# Patient Record
Sex: Male | Born: 1980 | Race: Black or African American | Hispanic: No | Marital: Married | Smoking: Current every day smoker
Health system: Southern US, Community
[De-identification: ages and names within clinical notes are randomized; demographics above are authoritative.]

## PROBLEM LIST (undated history)

## (undated) HISTORY — PX: TRACHEOSTOMY: SUR1362

---

## 2012-05-19 ENCOUNTER — Inpatient Hospital Stay (HOSPITAL_COMMUNITY)
Admission: EM | Admit: 2012-05-19 | Discharge: 2012-05-25 | DRG: 660 | Disposition: A | Discharge status: Home / Self Care | Payer: No Typology Code available for payment source | Attending: Urology | Admitting: Urology

## 2012-05-19 ENCOUNTER — Emergency Department (HOSPITAL_COMMUNITY): Payer: No Typology Code available for payment source

## 2012-05-19 DIAGNOSIS — S37009A Unspecified injury of unspecified kidney, initial encounter: Secondary | ICD-10-CM | POA: Diagnosis present

## 2012-05-19 DIAGNOSIS — S0181XA Laceration without foreign body of other part of head, initial encounter: Secondary | ICD-10-CM

## 2012-05-19 DIAGNOSIS — E876 Hypokalemia: Secondary | ICD-10-CM

## 2012-05-19 DIAGNOSIS — Q605 Renal hypoplasia, unspecified: Secondary | ICD-10-CM

## 2012-05-19 DIAGNOSIS — S069X9A Unspecified intracranial injury with loss of consciousness of unspecified duration, initial encounter: Secondary | ICD-10-CM

## 2012-05-19 DIAGNOSIS — S060X9A Concussion with loss of consciousness of unspecified duration, initial encounter: Secondary | ICD-10-CM | POA: Diagnosis present

## 2012-05-19 DIAGNOSIS — F17200 Nicotine dependence, unspecified, uncomplicated: Secondary | ICD-10-CM | POA: Diagnosis present

## 2012-05-19 DIAGNOSIS — Q602 Renal agenesis, unspecified: Secondary | ICD-10-CM

## 2012-05-19 DIAGNOSIS — N289 Disorder of kidney and ureter, unspecified: Secondary | ICD-10-CM

## 2012-05-19 DIAGNOSIS — Z5331 Laparoscopic surgical procedure converted to open procedure: Secondary | ICD-10-CM

## 2012-05-19 DIAGNOSIS — D62 Acute posthemorrhagic anemia: Secondary | ICD-10-CM | POA: Diagnosis not present

## 2012-05-19 DIAGNOSIS — S060X0A Concussion without loss of consciousness, initial encounter: Secondary | ICD-10-CM | POA: Diagnosis present

## 2012-05-19 DIAGNOSIS — S37069A Major laceration of unspecified kidney, initial encounter: Principal | ICD-10-CM | POA: Diagnosis present

## 2012-05-19 DIAGNOSIS — S060XAA Concussion with loss of consciousness status unknown, initial encounter: Secondary | ICD-10-CM | POA: Diagnosis present

## 2012-05-19 DIAGNOSIS — IMO0002 Reserved for concepts with insufficient information to code with codable children: Secondary | ICD-10-CM | POA: Diagnosis present

## 2012-05-19 LAB — CBC
HCT: 42.3 % (ref 39.0–52.0)
MCV: 85.6 fL (ref 78.0–100.0)
Platelets: 221 10*3/uL (ref 150–400)
RBC: 4.94 MIL/uL (ref 4.22–5.81)
RDW: 12 % (ref 11.5–15.5)
WBC: 11.6 10*3/uL — ABNORMAL HIGH (ref 4.0–10.5)

## 2012-05-19 LAB — POCT I-STAT, CHEM 8
Chloride: 99 mEq/L (ref 96–112)
Glucose, Bld: 132 mg/dL — ABNORMAL HIGH (ref 70–99)
HCT: 46 % (ref 39.0–52.0)
Hemoglobin: 15.6 g/dL (ref 13.0–17.0)
Potassium: 2.9 mEq/L — ABNORMAL LOW (ref 3.5–5.1)
Sodium: 139 mEq/L (ref 135–145)

## 2012-05-19 MED ORDER — ONDANSETRON HCL 4 MG/2ML IJ SOLN
INTRAMUSCULAR | Status: AC
  Filled 2012-05-19: qty 2

## 2012-05-19 MED ORDER — ONDANSETRON HCL 4 MG/2ML IJ SOLN
4.0000 mg | Freq: Once | INTRAMUSCULAR | Status: AC
  Administered 2012-05-19: 4 mg via INTRAVENOUS

## 2012-05-19 NOTE — ED Notes (Signed)
Positive FAST per Dr. Hyacinth Meeker

## 2012-05-19 NOTE — ED Notes (Signed)
Xray at [facility] 
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
## 2012-05-19 NOTE — ED Notes (Signed)
Xray finished, EDP at [facility] with FAST [place]

## 2012-05-19 NOTE — ED Notes (Signed)
Lab at [facility] 
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
## 2012-05-20 ENCOUNTER — Emergency Department (HOSPITAL_COMMUNITY): Payer: No Typology Code available for payment source

## 2012-05-20 ENCOUNTER — Observation Stay (HOSPITAL_COMMUNITY): Payer: No Typology Code available for payment source

## 2012-05-20 ENCOUNTER — Encounter (HOSPITAL_COMMUNITY): Payer: Self-pay | Admitting: *Deleted

## 2012-05-20 DIAGNOSIS — S060XAA Concussion with loss of consciousness status unknown, initial encounter: Secondary | ICD-10-CM

## 2012-05-20 DIAGNOSIS — S060X9A Concussion with loss of consciousness of unspecified duration, initial encounter: Secondary | ICD-10-CM

## 2012-05-20 DIAGNOSIS — S37009A Unspecified injury of unspecified kidney, initial encounter: Secondary | ICD-10-CM

## 2012-05-20 LAB — COMPREHENSIVE METABOLIC PANEL
Albumin: 3.7 g/dL (ref 3.5–5.2)
BUN: 11 mg/dL (ref 6–23)
Creatinine, Ser: 1.49 mg/dL — ABNORMAL HIGH (ref 0.50–1.35)
GFR calc Af Amer: 72 mL/min — ABNORMAL LOW (ref >90)
Glucose, Bld: 139 mg/dL — ABNORMAL HIGH (ref 70–99)
Total Bilirubin: 0.4 mg/dL (ref 0.3–1.2)
Total Protein: 6.8 g/dL (ref 6.0–8.3)

## 2012-05-20 LAB — ETHANOL: Alcohol, Ethyl (B): 188 mg/dL — ABNORMAL HIGH (ref 0–11)

## 2012-05-20 LAB — URINALYSIS, MICROSCOPIC ONLY
Bilirubin Urine: NEGATIVE
Nitrite: NEGATIVE
Specific Gravity, Urine: 1.036 — ABNORMAL HIGH (ref 1.005–1.030)
Urobilinogen, UA: 0.2 mg/dL (ref 0.0–1.0)
pH: 6 (ref 5.0–8.0)

## 2012-05-20 LAB — CBC
HCT: 41.3 % (ref 39.0–52.0)
Hemoglobin: 14.2 g/dL (ref 13.0–17.0)
MCH: 29.5 pg (ref 26.0–34.0)
MCHC: 34.4 g/dL (ref 30.0–36.0)
RDW: 12 % (ref 11.5–15.5)

## 2012-05-20 LAB — SAMPLE TO BLOOD BANK

## 2012-05-20 LAB — BASIC METABOLIC PANEL
BUN: 11 mg/dL (ref 6–23)
Calcium: 8.2 mg/dL — ABNORMAL LOW (ref 8.4–10.5)
GFR calc Af Amer: 78 mL/min — ABNORMAL LOW (ref >90)
GFR calc non Af Amer: 67 mL/min — ABNORMAL LOW (ref >90)
Glucose, Bld: 128 mg/dL — ABNORMAL HIGH (ref 70–99)
Potassium: 4.3 mEq/L (ref 3.5–5.1)

## 2012-05-20 LAB — CG4 I-STAT (LACTIC ACID): Lactic Acid, Venous: 3.2 mmol/L — ABNORMAL HIGH (ref 0.5–2.2)

## 2012-05-20 MED ORDER — HYDROCODONE-ACETAMINOPHEN 5-325 MG PO TABS
2.0000 | ORAL_TABLET | ORAL | Status: DC | PRN
  Administered 2012-05-20 – 2012-05-21 (×3): 2 via ORAL
  Filled 2012-05-20 (×3): qty 2

## 2012-05-20 MED ORDER — ONDANSETRON HCL 4 MG PO TABS: 4.0000 mg | ORAL_TABLET | Freq: Four times a day (QID) | ORAL | Status: DC | PRN

## 2012-05-20 MED ORDER — FENTANYL CITRATE 0.05 MG/ML IJ SOLN
50.0000 ug | Freq: Once | INTRAMUSCULAR | Status: AC
  Administered 2012-05-20: 50 ug via INTRAVENOUS

## 2012-05-20 MED ORDER — PANTOPRAZOLE SODIUM 40 MG PO TBEC: 40.0000 mg | DELAYED_RELEASE_TABLET | Freq: Every day | ORAL | Status: DC

## 2012-05-20 MED ORDER — THIAMINE HCL 100 MG/ML IJ SOLN
100.0000 mg | Freq: Every day | INTRAMUSCULAR | Status: DC
  Administered 2012-05-20: 100 mg via INTRAVENOUS
  Filled 2012-05-20 (×3): qty 1

## 2012-05-20 MED ORDER — VITAMIN B-1 100 MG PO TABS
100.0000 mg | ORAL_TABLET | Freq: Every day | ORAL | Status: DC
  Administered 2012-05-21 – 2012-05-22 (×2): 100 mg via ORAL
  Filled 2012-05-20 (×3): qty 1

## 2012-05-20 MED ORDER — NICOTINE 21 MG/24HR TD PT24
21.0000 mg | MEDICATED_PATCH | Freq: Every day | TRANSDERMAL | Status: DC
  Administered 2012-05-20 – 2012-05-22 (×3): 21 mg via TRANSDERMAL
  Filled 2012-05-20 (×4): qty 1

## 2012-05-20 MED ORDER — FENTANYL CITRATE 0.05 MG/ML IJ SOLN
INTRAMUSCULAR | Status: AC | PRN
  Administered 2012-05-20: 50 ug via INTRAVENOUS

## 2012-05-20 MED ORDER — HYDROMORPHONE HCL PF 1 MG/ML IJ SOLN
1.0000 mg | INTRAMUSCULAR | Status: DC | PRN
  Administered 2012-05-20 (×5): 1 mg via INTRAVENOUS
  Filled 2012-05-20 (×4): qty 1

## 2012-05-20 MED ORDER — FENTANYL CITRATE 0.05 MG/ML IJ SOLN
INTRAMUSCULAR | Status: AC
  Filled 2012-05-20: qty 2

## 2012-05-20 MED ORDER — LORAZEPAM 2 MG/ML IJ SOLN
1.0000 mg | Freq: Four times a day (QID) | INTRAMUSCULAR | Status: DC | PRN
  Administered 2012-05-20: 1 mg via INTRAVENOUS

## 2012-05-20 MED ORDER — ONDANSETRON HCL 4 MG/2ML IJ SOLN
INTRAMUSCULAR | Status: AC
  Filled 2012-05-20: qty 2

## 2012-05-20 MED ORDER — FENTANYL CITRATE 0.05 MG/ML IJ SOLN
INTRAMUSCULAR | Status: AC
  Filled 2012-05-20: qty 4

## 2012-05-20 MED ORDER — KCL IN DEXTROSE-NACL 20-5-0.45 MEQ/L-%-% IV SOLN
INTRAVENOUS | Status: DC
  Administered 2012-05-20 (×2): via INTRAVENOUS
  Filled 2012-05-20 (×5): qty 1000

## 2012-05-20 MED ORDER — HYDROMORPHONE HCL PF 1 MG/ML IJ SOLN
INTRAMUSCULAR | Status: AC
  Administered 2012-05-20: 1 mg via INTRAVENOUS
  Filled 2012-05-20: qty 1

## 2012-05-20 MED ORDER — FOLIC ACID 1 MG PO TABS
1.0000 mg | ORAL_TABLET | Freq: Every day | ORAL | Status: DC
  Administered 2012-05-20 – 2012-05-22 (×3): 1 mg via ORAL
  Filled 2012-05-20 (×3): qty 1

## 2012-05-20 MED ORDER — BISACODYL 10 MG RE SUPP: 10.0000 mg | Freq: Every day | RECTAL | Status: DC | PRN

## 2012-05-20 MED ORDER — MIDAZOLAM HCL 2 MG/2ML IJ SOLN
INTRAMUSCULAR | Status: AC | PRN
  Administered 2012-05-20 (×2): 1 mg via INTRAVENOUS

## 2012-05-20 MED ORDER — IOHEXOL 300 MG/ML  SOLN
100.0000 mL | Freq: Once | INTRAMUSCULAR | Status: AC | PRN
  Administered 2012-05-20: 100 mL via INTRAVENOUS

## 2012-05-20 MED ORDER — LORAZEPAM 1 MG PO TABS: 1.0000 mg | ORAL_TABLET | Freq: Four times a day (QID) | ORAL | Status: DC | PRN

## 2012-05-20 MED ORDER — PANTOPRAZOLE SODIUM 40 MG IV SOLR
40.0000 mg | Freq: Every day | INTRAVENOUS | Status: DC
  Administered 2012-05-20: 40 mg via INTRAVENOUS
  Filled 2012-05-20 (×2): qty 40

## 2012-05-20 MED ORDER — MIDAZOLAM HCL 2 MG/2ML IJ SOLN
INTRAMUSCULAR | Status: AC
  Filled 2012-05-20: qty 4

## 2012-05-20 MED ORDER — LORAZEPAM 2 MG/ML IJ SOLN
INTRAMUSCULAR | Status: AC
  Filled 2012-05-20: qty 1

## 2012-05-20 MED ORDER — ADULT MULTIVITAMIN W/MINERALS CH
1.0000 | ORAL_TABLET | Freq: Every day | ORAL | Status: DC
  Administered 2012-05-20 – 2012-05-22 (×3): 1 via ORAL
  Filled 2012-05-20 (×3): qty 1

## 2012-05-20 MED ORDER — ONDANSETRON HCL 4 MG/2ML IJ SOLN: 4.0000 mg | Freq: Four times a day (QID) | INTRAMUSCULAR | Status: DC | PRN

## 2012-05-20 MED ORDER — IOHEXOL 300 MG/ML  SOLN
2.0000 mL | Freq: Once | INTRAMUSCULAR | Status: AC | PRN
  Administered 2012-05-20: 2 mL via INTRAVENOUS

## 2012-05-20 MED ORDER — ONDANSETRON HCL 4 MG/2ML IJ SOLN
4.0000 mg | Freq: Once | INTRAMUSCULAR | Status: AC
  Administered 2012-05-20: 4 mg via INTRAVENOUS

## 2012-05-20 MED ORDER — DOCUSATE SODIUM 100 MG PO CAPS
100.0000 mg | ORAL_CAPSULE | Freq: Two times a day (BID) | ORAL | Status: DC
  Administered 2012-05-20 – 2012-05-22 (×4): 100 mg via ORAL
  Filled 2012-05-20 (×5): qty 1

## 2012-05-20 NOTE — ED Notes (Signed)
CT complete, BP trending down, HR trending up.
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
## 2012-05-20 NOTE — ED Notes (Signed)
EDP at [facility], IV tubing changed, no changes, remains alert, NAD, restless, moaning, cooperative, following commands, answering questions, family (x6) with chaplain.

## 2012-05-20 NOTE — Progress Notes (Signed)
Pt transferred to interventional radiology per MD order.

## 2012-05-20 NOTE — Consult Note (Signed)
Agree with PA note.  Discussed case with Dr. Berneice Heinrich.  Will put in a percutaneous nephrostomy tube for urinary diversion until pt can be scheduled for semi-elective laparoscopic nephrectomy early next week.  Will use CT guidance given abnormal anatomy and perinephric hemato-urinoma.  Signed,  Sterling Big, MD Vascular & Interventional Radiologist [facility]

## 2012-05-20 NOTE — ED Notes (Signed)
HR trending upward HR now 124, increased since ativan and dilaudid given, Dr. Lindie Spruce paged. Report called to 3300.

## 2012-05-20 NOTE — Procedures (Signed)
Interventional Radiology Procedure Note  Procedure: Right percutaneous nephrostomy under CT guidance and through the tube CT nephrostogram.  A 11F Cook APD was placed through a posterior interpolar calyx and positioned within the renal pelvis under CT guidance.  Nephrostogram and axial CT confirms location within the collecting system and renal pelvis rupture.  Complications: None Recommendations: - Maintain tube to gravity drainage - May flush gently with 5-10 mL saline PRN - Continue to monitor H&H and renal function - Per Urology, nephrectomy planned for early next week  Signed,  Sterling Big, MD Vascular & Interventional Radiologist [facility] Radiology

## 2012-05-20 NOTE — ED Notes (Signed)
Lab at [facility] drawing blood for [facility]
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
## 2012-05-20 NOTE — ED Notes (Signed)
Dr. Lindie Spruce at [facility] speaking with family.

## 2012-05-20 NOTE — ED Notes (Signed)
Pt restless and moving in CT, delay d/t movement, not able to follow "be still commands", securing extremeties. VSS, no changes.

## 2012-05-20 NOTE — ED Notes (Signed)
Pt's allergies updated per family request

## 2012-05-20 NOTE — ED Notes (Signed)
Scanning kidney delay

## 2012-05-20 NOTE — ED Notes (Signed)
Foley ordered per Dr. Lindie Spruce
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
## 2012-05-20 NOTE — ED Notes (Signed)
No changes, VSS, preparing to move pt to room 33, family called for, intermittant sonorous resps, arousable to voice.

## 2012-05-20 NOTE — ED Notes (Signed)
beginning scan at this time, no changes, moaning, following some commands, VSS.

## 2012-05-20 NOTE — ED Notes (Signed)
Contrast infusion delay, scan continues, no change, VSS.

## 2012-05-20 NOTE — ED Notes (Signed)
Lab at [facility] for 2nd blood draw for [facility]
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
## 2012-05-20 NOTE — Progress Notes (Signed)
Pharmacy asked to dose nicotine patch in patient who smokes ~half a pack a day.   Plan Nicotine patch 21mg  daily  Thank you,  Brett Fairy, PharmD, BCPS 05/20/2012 8:20 PM
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
## 2012-05-20 NOTE — ED Notes (Addendum)
Dilaudid and ativan given, sonorous resps increased, SPO2 dropped to 70% on 4L [place], placed on NRB 10L, SPO2 back up to 100%, family at [facility], no other changes, VSS. Family reports h/o undiagnosed sleep apnea.

## 2012-05-20 NOTE — ED Notes (Signed)
Dr. Lindie Spruce notified, no new orders.
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
## 2012-05-21 ENCOUNTER — Other Ambulatory Visit: Payer: Self-pay | Admitting: Urology

## 2012-05-21 DIAGNOSIS — S37009A Unspecified injury of unspecified kidney, initial encounter: Secondary | ICD-10-CM | POA: Diagnosis present

## 2012-05-21 DIAGNOSIS — S060X9A Concussion with loss of consciousness of unspecified duration, initial encounter: Secondary | ICD-10-CM | POA: Diagnosis present

## 2012-05-21 DIAGNOSIS — D62 Acute posthemorrhagic anemia: Secondary | ICD-10-CM | POA: Diagnosis not present

## 2012-05-21 DIAGNOSIS — Z720 Tobacco use: Secondary | ICD-10-CM | POA: Insufficient documentation

## 2012-05-21 LAB — CBC
MCH: 29.3 pg (ref 26.0–34.0)
Platelets: 176 10*3/uL (ref 150–400)
RBC: 4.06 MIL/uL — ABNORMAL LOW (ref 4.22–5.81)
RDW: 12 % (ref 11.5–15.5)
WBC: 10.3 10*3/uL (ref 4.0–10.5)

## 2012-05-21 LAB — BASIC METABOLIC PANEL
CO2: 27 mEq/L (ref 19–32)
Calcium: 8.6 mg/dL (ref 8.4–10.5)
GFR calc Af Amer: >90 mL/min (ref >90)
Sodium: 137 mEq/L (ref 135–145)

## 2012-05-21 MED ORDER — HYDROCODONE-ACETAMINOPHEN 10-325 MG PO TABS
0.5000 | ORAL_TABLET | ORAL | Status: DC | PRN
  Administered 2012-05-21 – 2012-05-22 (×4): 2 via ORAL
  Filled 2012-05-21 (×4): qty 2

## 2012-05-21 MED ORDER — SODIUM CHLORIDE 0.45 % IV SOLN
INTRAVENOUS | Status: DC
  Administered 2012-05-22: 09:00:00 via INTRAVENOUS

## 2012-05-21 MED ORDER — HYDROMORPHONE HCL PF 1 MG/ML IJ SOLN: 0.5000 mg | INTRAMUSCULAR | Status: DC | PRN

## 2012-05-21 NOTE — Progress Notes (Signed)
Patient transferred to 1B14.  Family at bedside.  Patient oriented to room.  Vital signs stable.  Will continue to monitor.
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
## 2012-05-21 NOTE — Progress Notes (Signed)
Doing fine with nephrostomy/retroperitoneal drain.  This patient has been seen and I agree with the findings and treatment plan.  Marta Lamas. Gae Bon, MD, FACS [phone] (pager) [phone] (direct pager) Trauma Surgeon
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
## 2012-05-21 NOTE — Progress Notes (Signed)
Pt transferred to 6N room 2 per MD order. Report called to receiving nurse and all questions answered.
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
## 2012-05-21 NOTE — Progress Notes (Signed)
UR completed 

## 2012-05-22 ENCOUNTER — Encounter (HOSPITAL_COMMUNITY): Payer: Self-pay | Admitting: Anesthesiology

## 2012-05-22 ENCOUNTER — Encounter (HOSPITAL_COMMUNITY): Admission: EM | Disposition: A | Discharge status: Home / Self Care | Payer: Self-pay | Source: Home / Self Care

## 2012-05-22 ENCOUNTER — Inpatient Hospital Stay (HOSPITAL_COMMUNITY): Payer: No Typology Code available for payment source | Admitting: Anesthesiology

## 2012-05-22 ENCOUNTER — Inpatient Hospital Stay: Admit: 2012-05-22 | Payer: Self-pay | Admitting: Urology

## 2012-05-22 HISTORY — PX: LAPAROSCOPIC NEPHRECTOMY: SHX1930

## 2012-05-22 LAB — BASIC METABOLIC PANEL
BUN: 10 mg/dL (ref 6–23)
CO2: 28 mEq/L (ref 19–32)
GFR calc non Af Amer: 82 mL/min — ABNORMAL LOW (ref >90)
Glucose, Bld: 153 mg/dL — ABNORMAL HIGH (ref 70–99)
Potassium: 4.1 mEq/L (ref 3.5–5.1)
Sodium: 133 mEq/L — ABNORMAL LOW (ref 135–145)

## 2012-05-22 LAB — TYPE AND SCREEN
ABO/RH(D): A POS
Antibody Screen: NEGATIVE

## 2012-05-22 LAB — HEMOGLOBIN AND HEMATOCRIT, BLOOD
HCT: 36.4 % — ABNORMAL LOW (ref 39.0–52.0)
Hemoglobin: 12.3 g/dL — ABNORMAL LOW (ref 13.0–17.0)

## 2012-05-22 LAB — ABO/RH: ABO/RH(D): A POS

## 2012-05-22 SURGERY — NEPHRECTOMY, RADICAL, LAPAROSCOPIC, ADULT
Anesthesia: General | Laterality: Right | Wound class: Clean

## 2012-05-22 MED ORDER — SENNA 8.6 MG PO TABS
1.0000 | ORAL_TABLET | Freq: Two times a day (BID) | ORAL | Status: DC
  Administered 2012-05-23 – 2012-05-25 (×5): 8.6 mg via ORAL
  Filled 2012-05-22 (×5): qty 1

## 2012-05-22 MED ORDER — CLINDAMYCIN PHOSPHATE 900 MG/50ML IV SOLN
900.0000 mg | Freq: Once | INTRAVENOUS | Status: AC
  Administered 2012-05-22: 900 mg via INTRAVENOUS
  Filled 2012-05-22: qty 50

## 2012-05-22 MED ORDER — CISATRACURIUM BESYLATE (PF) 10 MG/5ML IV SOLN
INTRAVENOUS | Status: DC | PRN
  Administered 2012-05-22: 2 mg via INTRAVENOUS
  Administered 2012-05-22: 10 mg via INTRAVENOUS
  Administered 2012-05-22: 4 mg via INTRAVENOUS
  Administered 2012-05-22: 2 mg via INTRAVENOUS

## 2012-05-22 MED ORDER — HYDROMORPHONE HCL PF 1 MG/ML IJ SOLN
0.5000 mg | INTRAMUSCULAR | Status: DC | PRN
  Administered 2012-05-22 – 2012-05-23 (×3): 1 mg via INTRAVENOUS
  Administered 2012-05-23 (×2): 0.5 mg via INTRAVENOUS
  Filled 2012-05-22 (×6): qty 1

## 2012-05-22 MED ORDER — DOCUSATE SODIUM 100 MG PO CAPS
100.0000 mg | ORAL_CAPSULE | Freq: Two times a day (BID) | ORAL | Status: DC
  Administered 2012-05-23 – 2012-05-25 (×5): 100 mg via ORAL
  Filled 2012-05-22 (×7): qty 1

## 2012-05-22 MED ORDER — BUPIVACAINE 0.25 % ON-Q PUMP SINGLE CATH 300ML
INJECTION | Status: DC | PRN
  Administered 2012-05-22: 300 mL

## 2012-05-22 MED ORDER — DEXAMETHASONE SODIUM PHOSPHATE 10 MG/ML IJ SOLN
INTRAMUSCULAR | Status: DC | PRN
  Administered 2012-05-22: 10 mg via INTRAVENOUS

## 2012-05-22 MED ORDER — ACETAMINOPHEN 10 MG/ML IV SOLN
INTRAVENOUS | Status: DC | PRN
  Administered 2012-05-22: 1000 mg via INTRAVENOUS

## 2012-05-22 MED ORDER — BUPIVACAINE 0.25 % ON-Q PUMP SINGLE CATH 300ML
INJECTION | Status: AC
  Filled 2012-05-22: qty 300

## 2012-05-22 MED ORDER — ACETAMINOPHEN 10 MG/ML IV SOLN
1000.0000 mg | Freq: Four times a day (QID) | INTRAVENOUS | Status: DC
  Administered 2012-05-22 – 2012-05-23 (×2): 1000 mg via INTRAVENOUS
  Filled 2012-05-22 (×4): qty 100

## 2012-05-22 MED ORDER — LACTATED RINGERS IV SOLN
INTRAVENOUS | Status: DC
  Administered 2012-05-22: 1000 mL via INTRAVENOUS

## 2012-05-22 MED ORDER — BUPIVACAINE LIPOSOME 1.3 % IJ SUSP
20.0000 mL | INTRAMUSCULAR | Status: DC
  Filled 2012-05-22: qty 20

## 2012-05-22 MED ORDER — SUFENTANIL CITRATE 50 MCG/ML IV SOLN
INTRAVENOUS | Status: DC | PRN
  Administered 2012-05-22: 5 ug via INTRAVENOUS
  Administered 2012-05-22: 10 ug via INTRAVENOUS
  Administered 2012-05-22: 5 ug via INTRAVENOUS
  Administered 2012-05-22: 10 ug via INTRAVENOUS
  Administered 2012-05-22: 5 ug via INTRAVENOUS
  Administered 2012-05-22: 10 ug via INTRAVENOUS
  Administered 2012-05-22: 5 ug via INTRAVENOUS

## 2012-05-22 MED ORDER — CIPROFLOXACIN IN D5W 400 MG/200ML IV SOLN
400.0000 mg | Freq: Two times a day (BID) | INTRAVENOUS | Status: DC
  Administered 2012-05-22: 400 mg via INTRAVENOUS

## 2012-05-22 MED ORDER — LACTATED RINGERS IR SOLN
Status: DC | PRN
  Administered 2012-05-22: 1000 mL

## 2012-05-22 MED ORDER — PROMETHAZINE HCL 25 MG/ML IJ SOLN: 6.2500 mg | INTRAMUSCULAR | Status: DC | PRN

## 2012-05-22 MED ORDER — ONDANSETRON HCL 4 MG/2ML IJ SOLN: 4.0000 mg | INTRAMUSCULAR | Status: DC | PRN

## 2012-05-22 MED ORDER — PROPOFOL 10 MG/ML IV BOLUS
INTRAVENOUS | Status: DC | PRN
  Administered 2012-05-22: 150 mg via INTRAVENOUS

## 2012-05-22 MED ORDER — HYDROMORPHONE HCL PF 1 MG/ML IJ SOLN
INTRAMUSCULAR | Status: AC
  Filled 2012-05-22: qty 1

## 2012-05-22 MED ORDER — MIDAZOLAM HCL 5 MG/5ML IJ SOLN
INTRAMUSCULAR | Status: DC | PRN
  Administered 2012-05-22: 2 mg via INTRAVENOUS

## 2012-05-22 MED ORDER — NEOSTIGMINE METHYLSULFATE 1 MG/ML IJ SOLN
INTRAMUSCULAR | Status: DC | PRN
  Administered 2012-05-22: 4 mg via INTRAVENOUS

## 2012-05-22 MED ORDER — GLYCOPYRROLATE 0.2 MG/ML IJ SOLN
INTRAMUSCULAR | Status: DC | PRN
  Administered 2012-05-22: .6 mg via INTRAVENOUS

## 2012-05-22 MED ORDER — LACTATED RINGERS IV SOLN
INTRAVENOUS | Status: DC | PRN
  Administered 2012-05-22 (×4): via INTRAVENOUS

## 2012-05-22 MED ORDER — HYDROMORPHONE HCL PF 1 MG/ML IJ SOLN
0.2500 mg | INTRAMUSCULAR | Status: DC | PRN
  Administered 2012-05-22 (×3): 0.25 mg via INTRAVENOUS
  Administered 2012-05-22: 0.5 mg via INTRAVENOUS
  Administered 2012-05-22 (×3): 0.25 mg via INTRAVENOUS

## 2012-05-22 MED ORDER — FENTANYL CITRATE 0.05 MG/ML IJ SOLN
INTRAMUSCULAR | Status: AC
  Administered 2012-05-22: 50 ug
  Filled 2012-05-22: qty 2

## 2012-05-22 MED ORDER — KCL IN DEXTROSE-NACL 10-5-0.45 MEQ/L-%-% IV SOLN
INTRAVENOUS | Status: DC
  Administered 2012-05-22: 22:00:00 via INTRAVENOUS
  Filled 2012-05-22 (×2): qty 1000

## 2012-05-22 MED ORDER — FENTANYL CITRATE 0.05 MG/ML IJ SOLN
25.0000 ug | INTRAMUSCULAR | Status: DC | PRN
  Administered 2012-05-22 (×2): 25 ug via INTRAVENOUS

## 2012-05-22 MED ORDER — LIDOCAINE HCL (CARDIAC) 20 MG/ML IV SOLN
INTRAVENOUS | Status: DC | PRN
  Administered 2012-05-22: 100 mg via INTRAVENOUS

## 2012-05-22 MED ORDER — HYDROMORPHONE HCL PF 1 MG/ML IJ SOLN
INTRAMUSCULAR | Status: DC | PRN
  Administered 2012-05-22: 1 mg via INTRAVENOUS

## 2012-05-22 MED ORDER — BUPIVACAINE ON-Q PAIN PUMP (FOR ORDER SET NO CHG)
INJECTION | Status: DC
  Filled 2012-05-22: qty 1

## 2012-05-22 MED ORDER — OXYCODONE HCL 5 MG PO TABS
5.0000 mg | ORAL_TABLET | ORAL | Status: DC | PRN
  Administered 2012-05-23 (×3): 5 mg via ORAL
  Filled 2012-05-22 (×3): qty 1

## 2012-05-22 MED ORDER — ONDANSETRON HCL 4 MG/2ML IJ SOLN
INTRAMUSCULAR | Status: DC | PRN
  Administered 2012-05-22: 4 mg via INTRAVENOUS

## 2012-05-22 SURGICAL SUPPLY — 68 items
BAG ZIPLOCK 12X15 (MISCELLANEOUS) IMPLANT
BENZOIN TINCTURE PRP APPL 2/3 (GAUZE/BANDAGES/DRESSINGS) ×2 IMPLANT
BLADE EXTENDED COATED 6.5IN (ELECTRODE) IMPLANT
BLADE SURG SZ10 CARB STEEL (BLADE) ×2 IMPLANT
CANISTER SUCTION 2500CC (MISCELLANEOUS) IMPLANT
CHLORAPREP W/TINT 26ML (MISCELLANEOUS) ×4 IMPLANT
CLIP LIGATING HEM O LOK PURPLE (MISCELLANEOUS) ×2 IMPLANT
CLIP LIGATING HEMO LOK XL GOLD (MISCELLANEOUS) ×4 IMPLANT
CLIP LIGATING HEMO O LOK GREEN (MISCELLANEOUS) ×2 IMPLANT
CLOTH BEACON ORANGE TIMEOUT ST (SAFETY) ×2 IMPLANT
COVER SURGICAL LIGHT HANDLE (MISCELLANEOUS) ×2 IMPLANT
CUTTER FLEX LINEAR 45M (STAPLE) IMPLANT
DERMABOND ADVANCED (GAUZE/BANDAGES/DRESSINGS)
DERMABOND ADVANCED .7 DNX12 (GAUZE/BANDAGES/DRESSINGS) IMPLANT
DRAIN CHANNEL 10F 3/8 F FF (DRAIN) IMPLANT
DRAIN CHANNEL 15F RND FF 3/16 (WOUND CARE) ×2 IMPLANT
DRAPE CAMERA CLOSED 9X96 (DRAPES) ×2 IMPLANT
DRAPE INCISE IOBAN 66X45 STRL (DRAPES) ×2 IMPLANT
DRAPE LAPAROSCOPIC ABDOMINAL (DRAPES) ×2 IMPLANT
DRAPE WARM FLUID 44X44 (DRAPE) IMPLANT
DRSG TEGADERM 4X4.75 (GAUZE/BANDAGES/DRESSINGS) IMPLANT
ELECT REM PT RETURN 9FT ADLT (ELECTROSURGICAL) ×2
ELECTRODE REM PT RTRN 9FT ADLT (ELECTROSURGICAL) ×1 IMPLANT
EVACUATOR SILICONE 100CC (DRAIN) ×2 IMPLANT
FLOSEAL (HEMOSTASIS) IMPLANT
FLOSEAL 10ML (HEMOSTASIS) IMPLANT
GLOVE BIOGEL M STRL SZ7.5 (GLOVE) ×12 IMPLANT
GOWN STRL NON-REIN LRG LVL3 (GOWN DISPOSABLE) ×6 IMPLANT
HEMOSTAT SURGICEL 4X8 (HEMOSTASIS) IMPLANT
KIT BASIN OR (CUSTOM PROCEDURE TRAY) ×2 IMPLANT
NEEDLE INSUFFLATION 14GA 120MM (NEEDLE) ×2 IMPLANT
PENCIL BUTTON HOLSTER BLD 10FT (ELECTRODE) ×2 IMPLANT
POSITIONER SURGICAL ARM (MISCELLANEOUS) IMPLANT
POUCH ENDO CATCH II 15MM (MISCELLANEOUS) IMPLANT
RELOAD 45 VASCULAR/THIN (ENDOMECHANICALS) IMPLANT
RELOAD WHITE ECR60W (STAPLE) ×12 IMPLANT
RETRACTOR LAPSCP 12X46 CVD (ENDOMECHANICALS) IMPLANT
RTRCTR LAPSCP 12X46 CVD (ENDOMECHANICALS)
SCALPEL HARMONIC ACE (MISCELLANEOUS) ×2 IMPLANT
SCISSORS LAP 5X35 DISP (ENDOMECHANICALS) IMPLANT
SET IRRIG TUBING LAPAROSCOPIC (IRRIGATION / IRRIGATOR) ×2 IMPLANT
SOLUTION ANTI FOG 6CC (MISCELLANEOUS) ×2 IMPLANT
SPONGE GAUZE 4X4 12PLY (GAUZE/BANDAGES/DRESSINGS) IMPLANT
SPONGE LAP 18X18 X RAY DECT (DISPOSABLE) ×2 IMPLANT
SPONGE LAP 4X18 X RAY DECT (DISPOSABLE) IMPLANT
SPONGE SURGIFOAM ABS GEL 100 (HEMOSTASIS) IMPLANT
STAPLE ECHEON FLEX 60 POW ENDO (STAPLE) ×2 IMPLANT
STRIP CLOSURE SKIN 1/4X4 (GAUZE/BANDAGES/DRESSINGS) ×2 IMPLANT
SUT ETHILON 3 0 PS 1 (SUTURE) ×2 IMPLANT
SUT MNCRL AB 4-0 PS2 18 (SUTURE) IMPLANT
SUT PDS AB 1 CTX 36 (SUTURE) ×8 IMPLANT
SUT SILK 0 (SUTURE) ×1
SUT SILK 0 30XBRD TIE 6 (SUTURE) ×1 IMPLANT
SUT VIC AB 2-0 SH 27 (SUTURE)
SUT VIC AB 2-0 SH 27X BRD (SUTURE) IMPLANT
SUT VICRYL 0 UR6 27IN ABS (SUTURE) IMPLANT
TAPE STRIPS DRAPE STRL (GAUZE/BANDAGES/DRESSINGS) IMPLANT
TOWEL OR 17X26 10 PK STRL BLUE (TOWEL DISPOSABLE) ×2 IMPLANT
TOWEL OR NON WOVEN STRL DISP B (DISPOSABLE) ×2 IMPLANT
TRAY FOLEY CATH 14FRSI W/METER (CATHETERS) IMPLANT
TRAY LAP CHOLE (CUSTOM PROCEDURE TRAY) ×2 IMPLANT
TROCAR BLADELESS OPT 5 100 (ENDOMECHANICALS) ×2 IMPLANT
TROCAR BLADELESS OPT 5 75 (ENDOMECHANICALS) IMPLANT
TROCAR ENDOPATH XCEL 12X100 BL (ENDOMECHANICALS) ×4 IMPLANT
TROCAR XCEL 12X100 BLDLESS (ENDOMECHANICALS) ×2 IMPLANT
TROCAR XCEL BLUNT TIP 100MML (ENDOMECHANICALS) IMPLANT
TUBING INSUFFLATION 10FT LAP (TUBING) ×2 IMPLANT
YANKAUER SUCT BULB TIP 10FT TU (MISCELLANEOUS) ×2 IMPLANT
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
## 2012-05-22 NOTE — Progress Notes (Signed)
Dr Berneice Heinrich  notified of H and H results.  No new orders.
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
## 2012-05-22 NOTE — Brief Op Note (Signed)
05/19/2012 - 05/22/2012  7:23 PM  PATIENT:  Shelah Lewandowsky  31 y.o. male  PRE-OPERATIVE DIAGNOSIS:  RIGHT ATROPHIC KIDNEY AND KIDNEY TRAUMA  POST-OPERATIVE DIAGNOSIS:  right atrophic kidney and kidney damage  PROCEDURE:  Procedure(s) (LRB) with comments: LAPAROSCOPIC NEPHRECTOMY (Right), Placement of Fascial Pain Pump (On-Q)  SURGEON:  Surgeon(s) and Role:    * Sebastian Ache, MD - Primary  PHYSICIAN ASSISTANT: Lujean Rave, PA  ASSISTANTS: none   ANESTHESIA:   general  EBL:     BLOOD ADMINISTERED:none  DRAINS: (RLQ) Jackson-Pratt drain(s) with closed bulb suction in the RLQ   LOCAL MEDICATIONS USED:  OTHER On-Q fasical pain pump  SPECIMEN:  Source of Specimen:  Rt Kidney  DISPOSITION OF SPECIMEN:  PATHOLOGY  COUNTS:  YES  TOURNIQUET:  * No tourniquets in log *  DICTATION: .Other Dictation: Dictation Number  [id number]  PLAN OF CARE: Admit to inpatient   PATIENT DISPOSITION:  PACU - hemodynamically stable.   Delay start of Pharmacological VTE agent (>24hrs) due to surgical blood loss or risk of bleeding: yes

## 2012-05-22 NOTE — Anesthesia Postprocedure Evaluation (Signed)
  Anesthesia Post-op Note  Patient: Peter Riggs  Procedure(s) Performed: Procedure(s) (LRB): LAPAROSCOPIC NEPHRECTOMY (Right)  Patient Location: PACU  Anesthesia Type: General  Level of Consciousness: awake and alert   Airway and Oxygen Therapy: Patient Spontanous Breathing  Post-op Pain: moderate.  Able to drift off to sleep. RR 10  Post-op Assessment: Post-op Vital signs reviewed, Patient's Cardiovascular Status Stable, Respiratory Function Stable, Patent Airway and No signs of Nausea or vomiting  Last Vitals:  Filed Vitals:   05/22/12 2015  BP: 151/72  Pulse: 92  Temp:   Resp: 8    Post-op Vital Signs: stable   Complications: No apparent anesthesia complications

## 2012-05-22 NOTE — Transfer of Care (Signed)
Immediate Anesthesia Transfer of Care Note  Patient: Peter Riggs  Procedure(s) Performed: Procedure(s) (LRB) with comments: LAPAROSCOPIC NEPHRECTOMY (Right)  Patient Location: PACU  Anesthesia Type:General  Level of Consciousness: awake, oriented and patient cooperative  Airway & Oxygen Therapy: Patient Spontanous Breathing and Patient connected to face mask oxygen  Post-op Assessment: Report given to PACU RN, Post -op Vital signs reviewed and stable and Patient moving all extremities  Post vital signs: Reviewed and stable  Complications: No apparent anesthesia complications

## 2012-05-22 NOTE — Progress Notes (Signed)
Transfer to [facility] for Dr. Berneice Heinrich to perform nephrectomy today Patient examined and I agree with the assessment and plan  Violeta Gelinas, MD, MPH, FACS Pager: [phone]  05/22/2012 9:59 AM

## 2012-05-22 NOTE — Progress Notes (Signed)
Patient transferred to [facility] via [facility].  Family with patient.  Vital signs stable, pre-procedure checklist complete.

## 2012-05-23 ENCOUNTER — Encounter (HOSPITAL_COMMUNITY): Payer: Self-pay | Admitting: Urology

## 2012-05-23 LAB — BASIC METABOLIC PANEL
CO2: 28 mEq/L (ref 19–32)
Calcium: 8.6 mg/dL (ref 8.4–10.5)
Chloride: 94 mEq/L — ABNORMAL LOW (ref 96–112)
Glucose, Bld: 154 mg/dL — ABNORMAL HIGH (ref 70–99)
Potassium: 4.5 mEq/L (ref 3.5–5.1)
Sodium: 131 mEq/L — ABNORMAL LOW (ref 135–145)

## 2012-05-23 LAB — HEMOGLOBIN AND HEMATOCRIT, BLOOD: HCT: 29.7 % — ABNORMAL LOW (ref 39.0–52.0)

## 2012-05-23 MED ORDER — ACETAMINOPHEN 500 MG PO TABS
1000.0000 mg | ORAL_TABLET | Freq: Three times a day (TID) | ORAL | Status: DC
  Administered 2012-05-23 – 2012-05-25 (×6): 1000 mg via ORAL
  Filled 2012-05-23 (×10): qty 2

## 2012-05-23 MED ORDER — BISACODYL 10 MG RE SUPP
10.0000 mg | Freq: Every day | RECTAL | Status: DC | PRN
  Administered 2012-05-23: 10 mg via RECTAL
  Filled 2012-05-23: qty 1

## 2012-05-23 MED ORDER — OXYCODONE HCL 5 MG PO TABS
10.0000 mg | ORAL_TABLET | ORAL | Status: DC | PRN
  Administered 2012-05-23 – 2012-05-25 (×7): 10 mg via ORAL
  Filled 2012-05-23 (×3): qty 2
  Filled 2012-05-23: qty 1
  Filled 2012-05-23: qty 2
  Filled 2012-05-23: qty 1
  Filled 2012-05-23 (×3): qty 2
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
## 2012-05-23 NOTE — Progress Notes (Signed)
1 Day Post-Op  Subjective:  1 - High Grade Injury to Poorly Functional Congenitally Abnormal Rt Kidney - POD 1  S/p laparoscopic / open extraction of Rt kidney.   Hgb acceptable. Tollerating Diet. Pain controlled. Ambulatory this AM. Voiding with foley out.   Objective: Vital signs in last 24 hours: Temp:  [98 F (36.7 C)-99.1 F (37.3 C)] 98.7 F (37.1 C) (12/18 0535) Pulse Rate:  [70-98] 70  (12/18 0535) Resp:  [8-18] 16  (12/18 0535) BP: (112-159)/(56-89) 120/56 mmHg (12/18 0535) SpO2:  [95 %-100 %] 95 % (12/18 0535) Last BM Date: 05/19/12  Intake/Output from previous day: 12/17 0701 - 12/18 0700 In: 3972.5 [P.O.:800; I.V.:3172.5] Out: 2907 [Urine:2055; Drains:152; Blood:700] Intake/Output this shift: Total I/O In: 972.5 [P.O.:800; I.V.:172.5] Out: 1757 [Urine:1205; Drains:152; Blood:400]  General appearance: alert, cooperative and appears stated age Head: Normocephalic, without obvious abnormality, atraumatic Eyes: conjunctivae/corneas clear. PERRL, EOM's intact. Fundi benign. Ears: normal TM's and external ear canals both ears Nose: Nares normal. Septum midline. Mucosa normal. No drainage or sinus tenderness. Throat: lips, mucosa, and tongue normal; teeth and gums normal Neck: no adenopathy, no carotid bruit, no JVD, supple, symmetrical, trachea midline and thyroid not enlarged, symmetric, no tenderness/mass/nodules Back: symmetric, no curvature. ROM normal. No CVA tenderness. Resp: clear to auscultation bilaterally Cardio: regular rate and rhythm, S1, S2 normal, no murmur, click, rub or gallop GI: soft, non-tender; bowel sounds normal; no masses,  no organomegaly Male genitalia: normal Extremities: extremities normal, atraumatic, no cyanosis or edema Pulses: 2+ and symmetric Skin: Skin color, texture, turgor normal. No rashes or lesions Lymph nodes: Cervical, supraclavicular, and axillary nodes normal. Neurologic: Grossly normal Incision/Wound: Rt subcostal incision  with some mild serous drainage. No hernia / pus. Port sites c/d/i. JP with serous output. Pain pump catheter in good position.   Lab Results:   Basename 05/23/12 0541 05/22/12 2005 05/21/12 0600  WBC -- -- 10.3  HGB 10.3* 11.1* --  HCT 29.7* 32.9* --  PLT -- -- 176   BMET  Basename 05/22/12 2005 05/21/12 0600  NA 133* 137  K 4.1 3.9  CL 99 104  CO2 28 27  GLUCOSE 153* 107*  BUN 10 8  CREATININE 1.18 0.96  CALCIUM 8.5 8.6   PT/INR No results found for this basename: LABPROT:2,INR:2 in the last 72 hours ABG No results found for this basename: PHART:2,PCO2:2,PO2:2,HCO3:2 in the last 72 hours  Studies/Results: No results found.  Anti-infectives: Anti-infectives     Start     Dose/Rate Route Frequency Ordered Stop   05/22/12 1600   ciprofloxacin (CIPRO) IVPB 400 mg  Status:  Discontinued        400 mg 200 mL/hr over 60 Minutes Intravenous Every 12 hours 05/22/12 1427 05/22/12 2046   05/22/12 1500   clindamycin (CLEOCIN) IVPB 900 mg        900 mg 100 mL/hr over 30 Minutes Intravenous  Once 05/22/12 1427 05/22/12 1515          Assessment/Plan:  1 - High Grade Injury to Poorly Functional Congenitally Abnormal Rt Kidney -  Doing very well post-op.  -SLIV -Change to PO scheduled tylenol - Continue ambulation - Keep current drains - Possible DC as soon as tomorrow  Berneice Heinrich, Earlean Fidalgo 05/23/2012

## 2012-05-24 LAB — BASIC METABOLIC PANEL
BUN: 14 mg/dL (ref 6–23)
CO2: 31 mEq/L (ref 19–32)
Chloride: 100 mEq/L (ref 96–112)
Creatinine, Ser: 1.42 mg/dL — ABNORMAL HIGH (ref 0.50–1.35)
Potassium: 3.8 mEq/L (ref 3.5–5.1)

## 2012-05-24 NOTE — Progress Notes (Signed)
2 Days Post-Op  Subjective:  1 - High Grade Injury to Poorly Functional Congenitally Abnormal Rt Kidney - POD 2  S/p laparoscopic / open extraction of Rt kidney.   Hgb acceptable. Cr acceptable. Tollerating Diet. Pain controlled on PO meds only. JP removed today.  Objective: Vital signs in last 24 hours: Temp:  [97.9 F (36.6 C)-98.9 F (37.2 C)] 97.9 F (36.6 C) (12/19 0546) Pulse Rate:  [67-95] 67  (12/19 0546) Resp:  [14-18] 18  (12/19 0546) BP: (126-149)/(71-77) 126/71 mmHg (12/19 0546) SpO2:  [98 %-100 %] 98 % (12/19 0546) Last BM Date: 05/19/12  Intake/Output from previous day: 12/18 0701 - 12/19 0700 In: 485 [P.O.:480] Out: 53 [Urine:3; Drains:50] Intake/Output this shift: Total I/O In: 5 [Other:5] Out: -   General appearance: alert, cooperative and appears stated age Head: Normocephalic, without obvious abnormality, atraumatic Eyes: conjunctivae/corneas clear. PERRL, EOM's intact. Fundi benign. Ears: normal TM's and external ear canals both ears Nose: Nares normal. Septum midline. Mucosa normal. No drainage or sinus tenderness. Throat: lips, mucosa, and tongue normal; teeth and gums normal Back: symmetric, no curvature. ROM normal. No CVA tenderness. Resp: clear to auscultation bilaterally Cardio: regular rate and rhythm, S1, S2 normal, no murmur, click, rub or gallop GI: soft, non-tender; bowel sounds normal; no masses,  no organomegaly Male genitalia: normal Extremities: extremities normal, atraumatic, no cyanosis or edema Pulses: 2+ and symmetric Skin: Skin color, texture, turgor normal. No rashes or lesions Lymph nodes: Cervical, supraclavicular, and axillary nodes normal. Neurologic: Grossly normal Incision/Wound: JP removed as output minimal and serous. On-Q in good position, to remain. Incisions c/d/i withotu excess drainage or hernia.  Lab Results:   Basename 05/24/12 0445 05/23/12 0541  WBC -- --  HGB 9.9* 10.3*  HCT 28.2* 29.7*  PLT -- --    BMET  Basename 05/24/12 0445 05/23/12 0541  NA 137 131*  K 3.8 4.5  CL 100 94*  CO2 31 28  GLUCOSE 107* 154*  BUN 14 11  CREATININE 1.42* 1.17  CALCIUM 8.7 8.6   PT/INR No results found for this basename: LABPROT:2,INR:2 in the last 72 hours ABG No results found for this basename: PHART:2,PCO2:2,PO2:2,HCO3:2 in the last 72 hours  Studies/Results: No results found.  Anti-infectives: Anti-infectives     Start     Dose/Rate Route Frequency Ordered Stop   05/22/12 1600   ciprofloxacin (CIPRO) IVPB 400 mg  Status:  Discontinued        400 mg 200 mL/hr over 60 Minutes Intravenous Every 12 hours 05/22/12 1427 05/22/12 2046   05/22/12 1500   clindamycin (CLEOCIN) IVPB 900 mg        900 mg 100 mL/hr over 30 Minutes Intravenous  Once 05/22/12 1427 05/22/12 1515          Assessment/Plan:  1 - High Grade Injury to Poorly Functional Congenitally Abnormal Rt Kidney -  Doing very well post-op.  - JP Out - Continue PO meds only - Likely DC today in PM  [facility], Libby Goehring 05/24/2012

## 2012-05-25 LAB — BASIC METABOLIC PANEL
BUN: 20 mg/dL (ref 6–23)
Chloride: 100 mEq/L (ref 96–112)
GFR calc Af Amer: 87 mL/min — ABNORMAL LOW (ref >90)
Glucose, Bld: 98 mg/dL (ref 70–99)
Potassium: 3.9 mEq/L (ref 3.5–5.1)
Sodium: 136 mEq/L (ref 135–145)

## 2012-05-25 LAB — HEMOGLOBIN AND HEMATOCRIT, BLOOD: Hemoglobin: 9.4 g/dL — ABNORMAL LOW (ref 13.0–17.0)

## 2012-05-25 MED ORDER — OXYCODONE-ACETAMINOPHEN 5-325 MG PO TABS: 1.0000 | ORAL_TABLET | ORAL | Status: DC | PRN

## 2012-05-25 MED ORDER — SENNA-DOCUSATE SODIUM 8.6-50 MG PO TABS: 1.0000 | ORAL_TABLET | Freq: Two times a day (BID) | ORAL | Status: DC
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
## 2013-09-25 ENCOUNTER — Encounter: Payer: Self-pay | Admitting: Podiatry

## 2013-09-25 ENCOUNTER — Ambulatory Visit (INDEPENDENT_AMBULATORY_CARE_PROVIDER_SITE_OTHER): Payer: 59 | Admitting: Podiatry

## 2013-09-25 VITALS — BP 125/73 | HR 79 | Ht 68.0 in | Wt 160.0 lb

## 2013-09-25 DIAGNOSIS — M216X1 Other acquired deformities of right foot: Secondary | ICD-10-CM

## 2013-09-25 DIAGNOSIS — M79609 Pain in unspecified limb: Secondary | ICD-10-CM

## 2013-09-25 DIAGNOSIS — M216X2 Other acquired deformities of left foot: Secondary | ICD-10-CM

## 2013-09-25 DIAGNOSIS — Q828 Other specified congenital malformations of skin: Secondary | ICD-10-CM

## 2013-09-25 DIAGNOSIS — M79606 Pain in leg, unspecified: Secondary | ICD-10-CM

## 2013-09-25 DIAGNOSIS — M216X9 Other acquired deformities of unspecified foot: Secondary | ICD-10-CM

## 2013-09-25 NOTE — Patient Instructions (Signed)
Seen for painful feet. Severe porokeratotic lesion under 4th MPJ bilateral. All debrided. Need Custom orthotics. Return to prepare for Orthotics.

## 2013-09-25 NOTE — Progress Notes (Signed)
33 year old male presents complaining of pain on both feet for the past 4 years. 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   Objective: Severe contracted lesser digits 2-5 bilateral. Digital corns 4th and 5th left. Plantar porokeratotic lesion sub 4 and intradermal bleed on right foot. Severe forefoot varus with STJ hyperpronation. Flat arch bilateral.  Assessment: Flexible pes planus. Symptomatic porokeratosis sub 4 bilateral. 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   Plan: Reviewed findings and available options. All lesions debrided. Relieved pain. Need Orthotic shoe inserts.  Will prepare on next visit.

## 2013-09-27 ENCOUNTER — Ambulatory Visit: Payer: Self-pay | Admitting: Podiatry

## 2013-10-04 ENCOUNTER — Ambulatory Visit (INDEPENDENT_AMBULATORY_CARE_PROVIDER_SITE_OTHER): Payer: 59 | Admitting: Podiatry

## 2013-10-04 ENCOUNTER — Encounter: Payer: Self-pay | Admitting: Podiatry

## 2013-10-04 VITALS — BP 122/68 | HR 63 | Ht 68.0 in | Wt 160.0 lb

## 2013-10-04 DIAGNOSIS — M79606 Pain in leg, unspecified: Secondary | ICD-10-CM

## 2013-10-04 DIAGNOSIS — M79609 Pain in unspecified limb: Secondary | ICD-10-CM

## 2013-10-04 DIAGNOSIS — Q828 Other specified congenital malformations of skin: Secondary | ICD-10-CM

## 2013-10-04 DIAGNOSIS — M216X2 Other acquired deformities of left foot: Principal | ICD-10-CM

## 2013-10-04 DIAGNOSIS — M216X9 Other acquired deformities of unspecified foot: Secondary | ICD-10-CM

## 2013-10-04 DIAGNOSIS — M216X1 Other acquired deformities of right foot: Secondary | ICD-10-CM

## 2013-10-07 NOTE — Progress Notes (Signed)
Patient returned to prepare for Orthotics.   X-rays taken. Severe increased lateral deviation angle of Calcaneocuboid angle bilateral. Severe lesser digital contracture bilateral. Severe anterior advancement of talar head wit anterior break in CYMA line bilateral  Assessment: Severe pronation deformity bilateral. Severe hammer toe deformities bilateral. Flexible flat foot bilateral.   Plan: Both feet are casted for Orthotics.

## 2013-10-07 NOTE — Patient Instructions (Signed)
Casted for Orthotics.

## 2013-10-11 ENCOUNTER — Encounter: Payer: 59 | Admitting: Podiatry

## 2013-12-04 ENCOUNTER — Ambulatory Visit: Payer: 59 | Admitting: Podiatry
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
## 2015-12-20 ENCOUNTER — Encounter (HOSPITAL_COMMUNITY): Payer: Self-pay

## 2015-12-20 ENCOUNTER — Emergency Department (HOSPITAL_COMMUNITY)
Admission: EM | Admit: 2015-12-20 | Discharge: 2015-12-20 | Disposition: A | Discharge status: Home / Self Care | Payer: PRIVATE HEALTH INSURANCE | Attending: Emergency Medicine | Admitting: Emergency Medicine

## 2015-12-20 DIAGNOSIS — K6289 Other specified diseases of anus and rectum: Secondary | ICD-10-CM | POA: Insufficient documentation

## 2015-12-20 DIAGNOSIS — E876 Hypokalemia: Secondary | ICD-10-CM

## 2015-12-20 DIAGNOSIS — G8929 Other chronic pain: Secondary | ICD-10-CM | POA: Insufficient documentation

## 2015-12-20 DIAGNOSIS — F17210 Nicotine dependence, cigarettes, uncomplicated: Secondary | ICD-10-CM | POA: Insufficient documentation

## 2015-12-20 LAB — I-STAT CHEM 8, ED
BUN: 16 mg/dL (ref 6–20)
CREATININE: 1.3 mg/dL — AB (ref 0.61–1.24)
Calcium, Ion: 1.13 mmol/L (ref 1.13–1.30)
Chloride: 107 mmol/L (ref 101–111)
GLUCOSE: 104 mg/dL — AB (ref 65–99)
HEMATOCRIT: 42 % (ref 39.0–52.0)
Hemoglobin: 14.3 g/dL (ref 13.0–17.0)
POTASSIUM: 3.3 mmol/L — AB (ref 3.5–5.1)
Sodium: 140 mmol/L (ref 135–145)
TCO2: 20 mmol/L (ref 0–100)

## 2015-12-20 LAB — POC OCCULT BLOOD, ED: Fecal Occult Bld: POSITIVE — AB

## 2015-12-20 MED ORDER — POTASSIUM CHLORIDE CRYS ER 20 MEQ PO TBCR
80.0000 meq | EXTENDED_RELEASE_TABLET | Freq: Once | ORAL | Status: AC
  Administered 2015-12-20: 80 meq via ORAL
  Filled 2015-12-20: qty 4

## 2015-12-20 NOTE — ED Notes (Signed)
Pt reports understanding of discharge information. No questions at time of discharge 

## 2015-12-20 NOTE — ED Notes (Signed)
PT reports only having slight bleeding when having bowel movement. Pt reports previous surgical incisions for the last year.

## 2015-12-20 NOTE — ED Notes (Signed)
PA at bedside.

## 2015-12-20 NOTE — Discharge Instructions (Signed)
1. Medications: usual home medications 2. Treatment: rest, drink plenty of fluids,  3. Follow Up: Please followup with your primary doctor in 7-10 days for discussion of your diagnoses and further evaluation after today's visit; if you do not have a primary care doctor use the resource guide provided to find one; Please return to the ER for fever, abd pain, persistent bleeding or other concerns

## 2015-12-20 NOTE — ED Notes (Signed)
Pt complaining of rectal pain and bleeding x2 years. Pt reports having gone to multiple doctors for this problem. He states that he is only prescribed Tylenol for the pain and it is not working for him. A&Ox4. Denies N/V/D.

## 2015-12-20 NOTE — ED Provider Notes (Signed)
CSN: [id number]     Arrival date & time 12/20/15  0414 History   First MD Initiated Contact with Patient 12/20/15 [phone]     Chief Complaint  Patient presents with  . Rectal Bleeding     (Consider location/radiation/quality/duration/timing/severity/associated sxs/prior Treatment) The history is provided by the patient and medical records. No language interpreter was used.     Peter LewandowskyMichael Riggs is a 35 y.o. male  with a hx of nephrectomy presents to the Emergency Department complaining of intermittent rectal bleeding due to hemorrhoids that became infected after I&D.  [facility].  He has been weaned off his percocet at his last visit 3 mos ago.  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  Pt reports concern that his wounds are not healing.  Pt reports chronic diarrhea for > 1 year.  No rectal bleeding, but bleeding from the surgical wounds.  He reports he only sees this on the toilet tissue when he wipes. No abd pain, N/V/D, weakness, dizziness, syncope, dysuria, fever, chills, night sweats.  Nothing really alleviates the pain.  He reports he is here tonight for a "second opinion."      Record review shows that pt is followed by [facility] with the last follow up in May 2017.  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 He reported a small amount of drainage from the wound but stool leakage has improved.  At that visit pt reported that the pain at surgical site has continued to decrease. It is also noted that he had discontinued Oxycodone for 1 month but received a prescription from another provider and at that visit was taking Oxycodone 10 mg once to twice a day. His oxycodone rx was not refilled by Dr. Elenore RotaSadiq and the medication was marked as discontinued on his discharge summary.     History reviewed. No pertinent past medical history. Past Surgical History  Procedure Laterality Date  . Tracheostomy    . Laparoscopic nephrectomy  05/22/2012    Procedure: LAPAROSCOPIC NEPHRECTOMY;  Surgeon: Sebastian Acheheodore Manny, MD;  Location: [facility];  Service: Urology;  Laterality: Right;   History reviewed. No pertinent family history. Social History  Substance Use Topics  . Smoking status: Current Every Day Smoker    Types: Cigarettes  . Smokeless tobacco: Never Used  . Alcohol Use: None    Review of Systems  Constitutional: Negative for fever, diaphoresis, appetite change, fatigue and unexpected weight change.  HENT: Negative for mouth sores.   Eyes: Negative for visual disturbance.  Respiratory: Negative for cough, chest tightness, shortness of breath and wheezing.   Cardiovascular: Negative for chest pain.  Gastrointestinal: Positive for hematochezia. Negative for nausea, vomiting, abdominal pain, diarrhea and constipation.  Endocrine: Negative for polydipsia, polyphagia and polyuria.  Genitourinary: Negative for dysuria, urgency, frequency and hematuria.       Surgical site pain and bleeding  Musculoskeletal: Negative for back pain and neck stiffness.  Skin: Negative for rash.  Allergic/Immunologic: Negative for immunocompromised state.  Neurological: Negative for syncope, light-headedness and headaches.  Hematological: Does not bruise/bleed easily.  Psychiatric/Behavioral: Negative for sleep disturbance. The patient is not nervous/anxious.       Allergies  Penicillins  Home Medications   Prior to Admission medications   Medication Sig  Start Date End Date Taking? Authorizing Provider  acetaminophen (TYLENOL) 500 MG tablet Take 1,000-1,500 mg by mouth every 6 (six) hours as needed (for pain.).   Yes Historical Provider, MD   BP 134/82 mmHg  Pulse 97  Temp(Src) 98.4 F (36.9 C) (Oral)  Resp 20  Ht  (1.702 m)  Wt 79.379 kg  BMI 27.40 kg/m2  SpO2  98% Physical Exam  Constitutional: He appears well-developed and well-nourished. No distress.  Awake, alert, nontoxic appearance  HENT:  Head: Normocephalic and atraumatic.  Mouth/Throat: Oropharynx is clear and moist. No oropharyngeal exudate.  Eyes: Conjunctivae are normal. No scleral icterus.  Neck: Normal range of motion. Neck supple.  Cardiovascular: Normal rate, regular rhythm and intact distal pulses.   Pulmonary/Chest: Effort normal and breath sounds normal. No respiratory distress. He has no wheezes.  Equal chest expansion  Abdominal: Soft. Bowel sounds are normal. He exhibits no mass. There is no tenderness. There is no rebound and no guarding.  Genitourinary:  Chronic, open but healing surgical wounds around the anus Patient tissue present with several spots that are friable, small amount of discharge noted No erythema, induration or increased warmth to suggest secondary infection DRE with very small amount of bright red bleeding from the friable tissue  Musculoskeletal: Normal range of motion. He exhibits no edema.  Neurological: He is alert.  Speech is clear and goal oriented Moves extremities without ataxia  Skin: Skin is warm and dry. He is not diaphoretic.  Psychiatric: He has a normal mood and affect.  Nursing note and vitals reviewed.   ED Course  Procedures (including critical care time) Labs Review Labs Reviewed  I-STAT CHEM 8, ED - Abnormal; Notable for the following:    Potassium 3.3 (*)    Creatinine, Ser 1.30 (*)    Glucose, Bld 104 (*)    All other components within normal limits  POC OCCULT BLOOD, ED - Abnormal; Notable for the following:    Fecal Occult Bld POSITIVE (*)    All other components within normal limits     MDM   Final diagnoses:  Chronic rectal pain  Hypokalemia   Peter Riggs presents for a very small amount of persistent bleeding from previous surgical wounds. Fecal occult is positive however bleeding is localized from a  small area of friable tissue at the anal opening.  Wounds do not appear to be secondarily infected. They are healing with granulation tissue.  No abdominal pain. Abdomen soft and nontender.  Chem-8 is reassuring. Hemoglobin is normal. Slight elevation in serum creatinine but this is baseline per patient. Mild hypokalemia noted. Repleted in the department.  She given referrals to pain management and colon and rectal surgery for further evaluation. He has an appointment with his surgeon at [facility] next month.  Vitals stable.  No indication of sepsis.  I do not feel the patient's chronic pain can be safely treated from the emergency department with opiates. Discussed this with him and recommended that he speak to his surgeon about this.  Dahlia Client Logen Fowle, PA-C 12/20/15 [id number]  Lyndal Pulley, MD 12/20/15 [phone]

## 2022-06-02 DIAGNOSIS — S300XXA Contusion of lower back and pelvis, initial encounter: Secondary | ICD-10-CM | POA: Diagnosis not present

## 2022-06-02 DIAGNOSIS — M25551 Pain in right hip: Secondary | ICD-10-CM | POA: Diagnosis not present

## 2022-06-02 DIAGNOSIS — Z79899 Other long term (current) drug therapy: Secondary | ICD-10-CM | POA: Diagnosis not present

## 2022-06-02 DIAGNOSIS — Y92481 Parking lot as the place of occurrence of the external cause: Secondary | ICD-10-CM | POA: Diagnosis not present

## 2022-06-02 DIAGNOSIS — R69 Illness, unspecified: Secondary | ICD-10-CM | POA: Diagnosis not present

## 2022-06-02 DIAGNOSIS — Z880 Allergy status to penicillin: Secondary | ICD-10-CM | POA: Diagnosis not present

## 2022-06-02 DIAGNOSIS — S3993XA Unspecified injury of pelvis, initial encounter: Secondary | ICD-10-CM | POA: Diagnosis not present

## 2022-06-02 DIAGNOSIS — M25511 Pain in right shoulder: Secondary | ICD-10-CM | POA: Diagnosis not present

## 2022-06-02 DIAGNOSIS — F17290 Nicotine dependence, other tobacco product, uncomplicated: Secondary | ICD-10-CM | POA: Diagnosis not present

## 2022-06-03 DIAGNOSIS — S3993XA Unspecified injury of pelvis, initial encounter: Secondary | ICD-10-CM | POA: Diagnosis not present
